# Patient Record
Sex: Female | Born: 1980 | Hispanic: Yes | Marital: Single | Smoking: Never smoker
Health system: Southern US, Community
[De-identification: ages and names within clinical notes are randomized; demographics above are authoritative.]

## PROBLEM LIST (undated history)

## (undated) DIAGNOSIS — R87619 Unspecified abnormal cytological findings in specimens from cervix uteri: Secondary | ICD-10-CM

## (undated) DIAGNOSIS — E039 Hypothyroidism, unspecified: Secondary | ICD-10-CM

## (undated) DIAGNOSIS — B977 Papillomavirus as the cause of diseases classified elsewhere: Secondary | ICD-10-CM

## (undated) HISTORY — DX: Unspecified abnormal cytological findings in specimens from cervix uteri: R87.619

## (undated) HISTORY — DX: Papillomavirus as the cause of diseases classified elsewhere: B97.7

## (undated) HISTORY — DX: Hypothyroidism, unspecified: E03.9

---

## 2008-01-27 ENCOUNTER — Emergency Department: Payer: Self-pay | Admitting: Emergency Medicine

## 2009-12-21 ENCOUNTER — Emergency Department: Payer: Self-pay | Admitting: Emergency Medicine

## 2018-04-10 ENCOUNTER — Ambulatory Visit: Payer: Self-pay | Admitting: Obstetrics and Gynecology

## 2018-04-10 ENCOUNTER — Ambulatory Visit: Payer: Self-pay

## 2018-04-17 ENCOUNTER — Ambulatory Visit: Payer: Self-pay

## 2018-05-10 ENCOUNTER — Ambulatory Visit: Payer: Self-pay

## 2018-05-10 ENCOUNTER — Ambulatory Visit: Payer: Self-pay | Admitting: Obstetrics and Gynecology

## 2018-05-22 ENCOUNTER — Other Ambulatory Visit (HOSPITAL_COMMUNITY)
Admission: RE | Admit: 2018-05-22 | Discharge: 2018-05-22 | Disposition: A | Discharge status: Home / Self Care | Payer: Self-pay | Source: Ambulatory Visit | Attending: Obstetrics and Gynecology | Admitting: Obstetrics and Gynecology

## 2018-05-22 ENCOUNTER — Encounter: Payer: Self-pay | Admitting: Obstetrics and Gynecology

## 2018-05-22 ENCOUNTER — Ambulatory Visit: Payer: Self-pay | Attending: Oncology

## 2018-05-22 ENCOUNTER — Other Ambulatory Visit: Payer: Self-pay

## 2018-05-22 ENCOUNTER — Encounter (INDEPENDENT_AMBULATORY_CARE_PROVIDER_SITE_OTHER): Payer: Self-pay

## 2018-05-22 ENCOUNTER — Ambulatory Visit (INDEPENDENT_AMBULATORY_CARE_PROVIDER_SITE_OTHER): Payer: Self-pay | Admitting: Obstetrics and Gynecology

## 2018-05-22 VITALS — BP 110/64 | Ht 60.0 in | Wt 126.0 lb

## 2018-05-22 VITALS — BP 126/88 | HR 99 | Temp 98.1°F | Ht 60.0 in | Wt 126.0 lb

## 2018-05-22 DIAGNOSIS — R87612 Low grade squamous intraepithelial lesion on cytologic smear of cervix (LGSIL): Secondary | ICD-10-CM | POA: Insufficient documentation

## 2018-05-22 NOTE — Progress Notes (Signed)
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  She is scheduled consult with Dr. Jean [facility]enthalJackson at [facility] this afternoon, with colposcopy and possible biopsy.

## 2018-05-22 NOTE — Progress Notes (Signed)
Referring Provider:  [facility] at [facility] for LGSIL pap smear.   HPI:  Jennifer Hester is a 37 y.o.  [id number]  who presents today in referral from [facility] program at [facility], Coralee RudAnne Shaver, RN, for evaluation and management of abnormal cervical cytology.  The patient has no acute complaints today.  Dysplasia History:  LGSIL, HPV +   OB History  Gravida Para Term Preterm AB Living  9 8 8   1 8   SAB TAB Ectopic Multiple Live Births  1       8    # Outcome Date GA Lbr Len/2nd Weight Sex Delivery Anes PTL Lv  9 Term           8 Term           7 Term           6 Term           5 Term           4 Term           3 Term           2 Term           1 SAB             Past Medical History:  Diagnosis Date  . Abnormal Pap smear of cervix   . Human papilloma virus   . Hypothyroidism     Past Surgical History:  Procedure Laterality Date  . CESAREAN SECTION      SOCIAL HISTORY:  Social History   Substance and Sexual Activity  Alcohol Use Never  . Frequency: Never    Social History   Substance and Sexual Activity  Drug Use Never     Family History  Problem Relation Age of Onset  . Hypertension Mother     ALLERGIES:  Patient has no known allergies.  Current Outpatient Medications on File Prior to Visit  Medication Sig Dispense Refill  . ferrous sulfate 325 (65 FE) MG tablet Take by mouth.    . Multiple Vitamin (MULTIVITAMIN) tablet Take 1 tablet by mouth daily.     No current facility-administered medications on file prior to visit.     Physical Exam: -Vitals:  BP 110/64   Ht 5' (1.524 m)   Wt 126 lb (57.2 kg)   LMP 05/07/2018   BMI 24.61 kg/m  GEN: WD, WN, NAD.  A+ O x 3, good mood and affect. ABD:  NT, ND.  Soft, no masses.  No hernias noted.   Pelvic:   Vulva: Normal appearance.  No lesions.  Vagina: No lesions or abnormalities noted.  Support: Normal pelvic support.  Urethra No masses tenderness or scarring.  Meatus Normal size without lesions or  prolapse.  Cervix: See below.  Anus: Normal exam.  No lesions.  Perineum: Normal exam.  No lesions.        Bimanual   Uterus: Normal size.  Non-tender.  Mobile.  AV.  Adnexae: No masses.  Non-tender to palpation.  Cul-de-sac: Negative for abnormality.   PROCEDURE: 1.  Urine Pregnancy Test:  negative 2.  Colposcopy performed with 4% acetic acid after verbal consent obtained                                         -Aceto-white Lesions Location(s): mildly diffusely              -  Biopsy performed at 2, 6, and 10 o'clock               -ECC indicated and performed: Yes.       -Biopsy sites made hemostatic with pressure, AgNO3, and/or Monsel's solution   -Satisfactory colposcopy: No.    -Evidence of Invasive cervical CA :  NO  ASSESSMENT:  Jennifer Hester is a 37 y.o. [id number] here for  1. LGSIL on Pap smear of cervix   .  PLAN: I discussed the grading system of pap smears and HPV high risk viral types.  We will discuss and base management after colpo results return.      Thomasene Mohair, MD  [facility], [facility] 05/22/2018  2:53 PM   CC: BCCCP Program Coralee Rud, RN [facility]

## 2018-05-25 ENCOUNTER — Encounter: Payer: Self-pay | Admitting: Obstetrics and Gynecology

## 2018-05-25 ENCOUNTER — Telehealth: Payer: Self-pay | Admitting: Obstetrics and Gynecology

## 2018-05-25 NOTE — Telephone Encounter (Signed)
Patient notified of CIN 1 results and recommendation to repeat pap smear in one year.  She voiced understanding and agreement.

## 2018-08-09 NOTE — Progress Notes (Addendum)
Patient scheduled for 1 year follow-up pap per Dr. Jean RosenthalJackson request.  Appointment is scheduled for BCCCP on 06/06/2019.  At that visit she is eligible for a mammogram since she turned 38 in March 2020.  Mailed appointment information.  Interpreter scheduled.  Copy to [facility].

## 2019-06-06 ENCOUNTER — Other Ambulatory Visit: Payer: Self-pay

## 2019-06-06 ENCOUNTER — Ambulatory Visit
Admission: RE | Admit: 2019-06-06 | Discharge: 2019-06-06 | Disposition: A | Discharge status: Home / Self Care | Payer: Self-pay | Source: Ambulatory Visit | Attending: Oncology | Admitting: Oncology

## 2019-06-06 ENCOUNTER — Ambulatory Visit: Payer: Self-pay | Attending: Oncology

## 2019-06-06 ENCOUNTER — Encounter (INDEPENDENT_AMBULATORY_CARE_PROVIDER_SITE_OTHER): Payer: Self-pay

## 2019-06-06 VITALS — BP 113/59 | HR 72 | Temp 96.4°F | Ht 60.5 in | Wt 116.0 lb

## 2019-06-06 DIAGNOSIS — Z0000 Encounter for general adult medical examination without abnormal findings: Secondary | ICD-10-CM

## 2019-06-06 NOTE — Progress Notes (Signed)
  Subjective:     Patient ID: Jennifer Hester, female   DOB: 01-15-1981, 38 y.o.   MRN: [id number]  HPI   Review of Systems     Objective:   Physical Exam Chest:     Breasts:        Right: No swelling, bleeding, inverted nipple, mass, nipple discharge, skin change or tenderness.        Left: No swelling, bleeding, inverted nipple, mass, nipple discharge, skin change or tenderness.        Assessment:     38 year old patient returns to [facility] for one year follow-up pap per Dr. Glennon Mac .  Patient is now eligible for initial mammogram. Patient screened, and meets BCCCP eligibility.  Patient does not have insurance, Medicare or Medicaid. Instructed patient on breast self awareness using teach back method.  Clinical breast exam unremarkable.  Patient had a miscarriage 3 months ago, and has been bleeding since her miscarriage.  States at first heavy, but is lighter now.      Plan:     Sent for bilateral screening mammogram. Will schedule appointment with Dr. Glennon Mac for pap.

## 2019-06-07 ENCOUNTER — Other Ambulatory Visit: Payer: Self-pay | Admitting: *Deleted

## 2019-06-07 DIAGNOSIS — N63 Unspecified lump in unspecified breast: Secondary | ICD-10-CM

## 2019-07-13 ENCOUNTER — Ambulatory Visit
Admission: RE | Admit: 2019-07-13 | Discharge: 2019-07-13 | Disposition: A | Discharge status: Home / Self Care | Payer: Self-pay | Source: Ambulatory Visit | Attending: Oncology | Admitting: Oncology

## 2019-07-13 ENCOUNTER — Other Ambulatory Visit: Payer: Self-pay | Admitting: *Deleted

## 2019-07-13 DIAGNOSIS — N63 Unspecified lump in unspecified breast: Secondary | ICD-10-CM

## 2020-05-06 NOTE — Progress Notes (Signed)
Radiologist reviewed Birads 3 results with patient.  Scheduled or annual and diagnostic mammogram, and ultrasound on 06/04/20 at 12:30.  Will need follow-up pap at that visit.  Copy to [facility].  Risk Assessment   No risk assessment data for the current encounter  Risk Scores      06/06/2019   Last edited by: Jim Like, RN   5-year risk: 0.2 %   Lifetime risk: 4.7 %

## 2020-06-04 ENCOUNTER — Ambulatory Visit: Payer: Self-pay | Attending: Oncology

## 2020-07-09 ENCOUNTER — Ambulatory Visit: Payer: Self-pay

## 2020-07-23 ENCOUNTER — Ambulatory Visit: Payer: Self-pay | Attending: Oncology

## 2020-07-23 ENCOUNTER — Ambulatory Visit
Admission: RE | Admit: 2020-07-23 | Discharge: 2020-07-23 | Disposition: A | Discharge status: Home / Self Care | Payer: Self-pay | Source: Ambulatory Visit | Attending: Oncology | Admitting: Oncology

## 2020-07-23 ENCOUNTER — Other Ambulatory Visit: Payer: Self-pay

## 2020-07-23 VITALS — BP 112/98 | HR 88 | Temp 99.2°F | Ht 61.5 in | Wt 134.0 lb

## 2020-07-23 DIAGNOSIS — N63 Unspecified lump in unspecified breast: Secondary | ICD-10-CM

## 2020-07-23 DIAGNOSIS — R87612 Low grade squamous intraepithelial lesion on cytologic smear of cervix (LGSIL): Secondary | ICD-10-CM

## 2020-07-23 NOTE — Progress Notes (Signed)
  Subjective:     Patient ID: Jennifer Hester, female   DOB: 1981-06-30, 39 y.o.   MRN: [id number]  HPI   Review of Systems     Objective:   Physical Exam Chest:     Breasts:        Right: No swelling, bleeding, inverted nipple, mass, nipple discharge, skin change or tenderness.        Left: No swelling, bleeding, inverted nipple, mass, nipple discharge, skin change or tenderness.  Genitourinary:    Labia:        Right: No rash, tenderness, lesion or injury.        Left: No rash, tenderness, lesion or injury.      Vagina: No signs of injury and foreign body. No vaginal discharge, erythema, tenderness, bleeding, lesions or prolapsed vaginal walls.     Cervix: Cervical bleeding present. No cervical motion tenderness, discharge, friability, lesion, erythema or eversion.     Uterus: Not deviated, not enlarged, not fixed, not tender and no uterine prolapse.      Adnexa:        Right: No mass, tenderness or fullness.         Left: No mass, tenderness or fullness.       Comments: Bright red blood noted from os when pap collected       Assessment:     39 year old Hispanic patient returns to [facility] clinic.  Being followed for right breast cyst.  Patient screened, and meets BCCCP eligibility.  Patient does not have insurance, Medicare or Medicaid. Instructed patient on breast self awareness using teach back method.  Clinical breast exam unremarkable.  Pelvic exam normal.  Scant bleeding on pap collection.  Patient states her menstrual cycle has beeb irregular, sometimes twice monthly. Had CIN biopsy results in 2019. Risk Assessment    Risk Scores      07/23/2020 06/06/2019   Last edited by: Jim Like, RN Jim Like, RN   5-year risk: 0.3 % 0.2 %   Lifetime risk: 4.7 % 4.7 %            Plan:     Sent for diagnostic mammogram, and ultrasound.  Specimen collected for pap.  To discuss irregular periods, low-grade temps x 4 months with primary provider.

## 2020-07-29 LAB — IGP, APTIMA HPV: HPV Aptima: NEGATIVE

## 2021-08-12 ENCOUNTER — Other Ambulatory Visit: Payer: Self-pay

## 2021-08-12 ENCOUNTER — Ambulatory Visit: Payer: Self-pay

## 2021-08-12 ENCOUNTER — Telehealth: Payer: Self-pay

## 2021-08-12 NOTE — Telephone Encounter (Signed)
Patient called/ pre- screened for BCCCP . Informed patient that she is scheduled to be at [facility] at 1230 today. Patient asked to reschedule mammogram, number provided. No further questions. Interpreter used.

## 2021-08-12 NOTE — Progress Notes (Unsigned)
Patient pre-screened for BCCCP eligibility due to COVID 19 precautions. Two patient identifiers used for verification that I was speaking to correct patient.  Patient unable to Present directly to [facility] today for BCCCP screening mammogram.  Jennifer Hester to schedule diagnostic mammogram. Orders are in.

## 2021-08-21 ENCOUNTER — Other Ambulatory Visit: Payer: Self-pay

## 2021-08-21 ENCOUNTER — Ambulatory Visit
Admission: RE | Admit: 2021-08-21 | Discharge: 2021-08-21 | Disposition: A | Discharge status: Home / Self Care | Payer: Self-pay | Source: Ambulatory Visit | Attending: Oncology | Admitting: Oncology

## 2021-08-21 DIAGNOSIS — N6310 Unspecified lump in the right breast, unspecified quadrant: Secondary | ICD-10-CM

## 2022-09-15 DIAGNOSIS — E890 Postprocedural hypothyroidism: Secondary | ICD-10-CM | POA: Diagnosis not present

## 2022-09-15 DIAGNOSIS — N9089 Other specified noninflammatory disorders of vulva and perineum: Secondary | ICD-10-CM | POA: Diagnosis not present

## 2022-09-15 DIAGNOSIS — N870 Mild cervical dysplasia: Secondary | ICD-10-CM | POA: Diagnosis not present

## 2022-09-15 DIAGNOSIS — R102 Pelvic and perineal pain: Secondary | ICD-10-CM | POA: Diagnosis not present

## 2022-09-15 DIAGNOSIS — E1165 Type 2 diabetes mellitus with hyperglycemia: Secondary | ICD-10-CM | POA: Diagnosis not present

## 2022-09-15 DIAGNOSIS — E119 Type 2 diabetes mellitus without complications: Secondary | ICD-10-CM | POA: Diagnosis not present

## 2022-09-15 DIAGNOSIS — Z3009 Encounter for other general counseling and advice on contraception: Secondary | ICD-10-CM | POA: Diagnosis not present

## 2022-09-16 ENCOUNTER — Other Ambulatory Visit: Payer: Self-pay | Admitting: Family Medicine

## 2022-09-16 DIAGNOSIS — Z1231 Encounter for screening mammogram for malignant neoplasm of breast: Secondary | ICD-10-CM

## 2022-10-28 DIAGNOSIS — L811 Chloasma: Secondary | ICD-10-CM | POA: Diagnosis not present

## 2022-11-02 DIAGNOSIS — Z20822 Contact with and (suspected) exposure to covid-19: Secondary | ICD-10-CM | POA: Diagnosis not present

## 2022-11-02 DIAGNOSIS — Z79899 Other long term (current) drug therapy: Secondary | ICD-10-CM | POA: Diagnosis not present

## 2022-11-02 DIAGNOSIS — R197 Diarrhea, unspecified: Secondary | ICD-10-CM | POA: Diagnosis not present

## 2022-11-02 DIAGNOSIS — E079 Disorder of thyroid, unspecified: Secondary | ICD-10-CM | POA: Diagnosis not present

## 2022-11-02 DIAGNOSIS — Z79890 Hormone replacement therapy (postmenopausal): Secondary | ICD-10-CM | POA: Diagnosis not present

## 2022-11-02 DIAGNOSIS — E039 Hypothyroidism, unspecified: Secondary | ICD-10-CM | POA: Diagnosis not present

## 2022-11-02 DIAGNOSIS — Z7984 Long term (current) use of oral hypoglycemic drugs: Secondary | ICD-10-CM | POA: Diagnosis not present

## 2022-11-02 DIAGNOSIS — E119 Type 2 diabetes mellitus without complications: Secondary | ICD-10-CM | POA: Diagnosis not present

## 2022-11-02 DIAGNOSIS — R519 Headache, unspecified: Secondary | ICD-10-CM | POA: Diagnosis not present

## 2022-11-02 DIAGNOSIS — Z1152 Encounter for screening for COVID-19: Secondary | ICD-10-CM | POA: Diagnosis not present

## 2022-11-02 DIAGNOSIS — Z794 Long term (current) use of insulin: Secondary | ICD-10-CM | POA: Diagnosis not present

## 2022-11-02 DIAGNOSIS — E860 Dehydration: Secondary | ICD-10-CM | POA: Diagnosis not present

## 2023-02-15 IMAGING — MG DIGITAL DIAGNOSTIC BILAT W/ TOMO W/ CAD
6 of 10 series · 6 of 30 positions shown · non-contrast
Comparison: Previous exam(s).

CLINICAL DATA: 40-year-old female presenting for follow-up of 2
likely benign masses in the retroareolar right breast.

EXAM:
DIGITAL DIAGNOSTIC BILATERAL MAMMOGRAM WITH TOMOSYNTHESIS AND CAD;
ULTRASOUND RIGHT BREAST LIMITED
TECHNIQUE: Bilateral digital diagnostic mammography and breast tomosynthesis
was performed. The images were evaluated with computer-aided
detection.; Targeted ultrasound examination of the right breast was
performed

[L CC synth-2D]
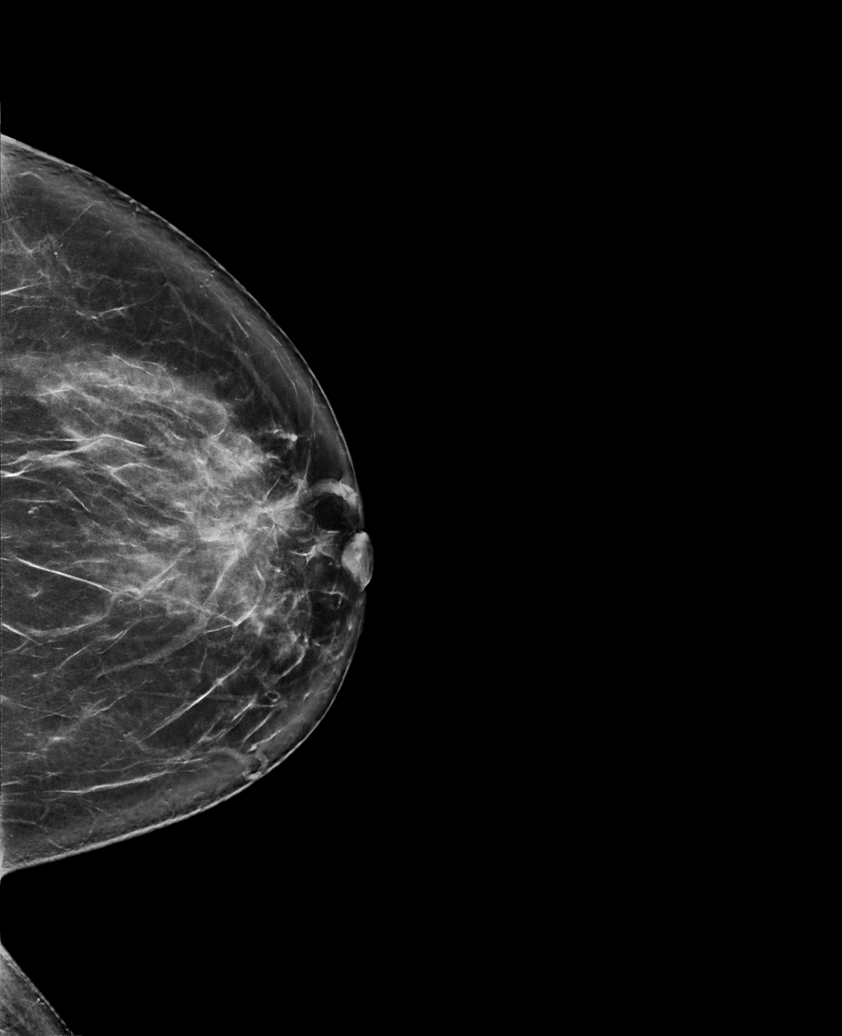

[R CC synth-2D (1 of 2)]
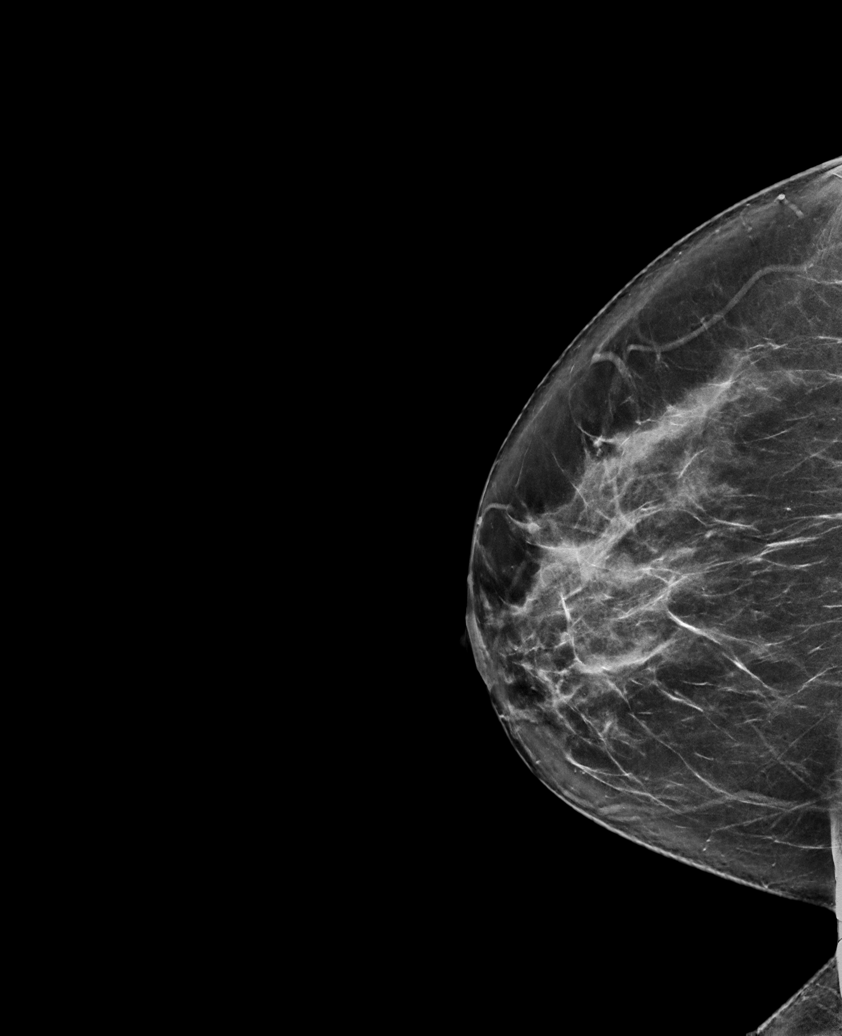

[R MLO synth-2D]
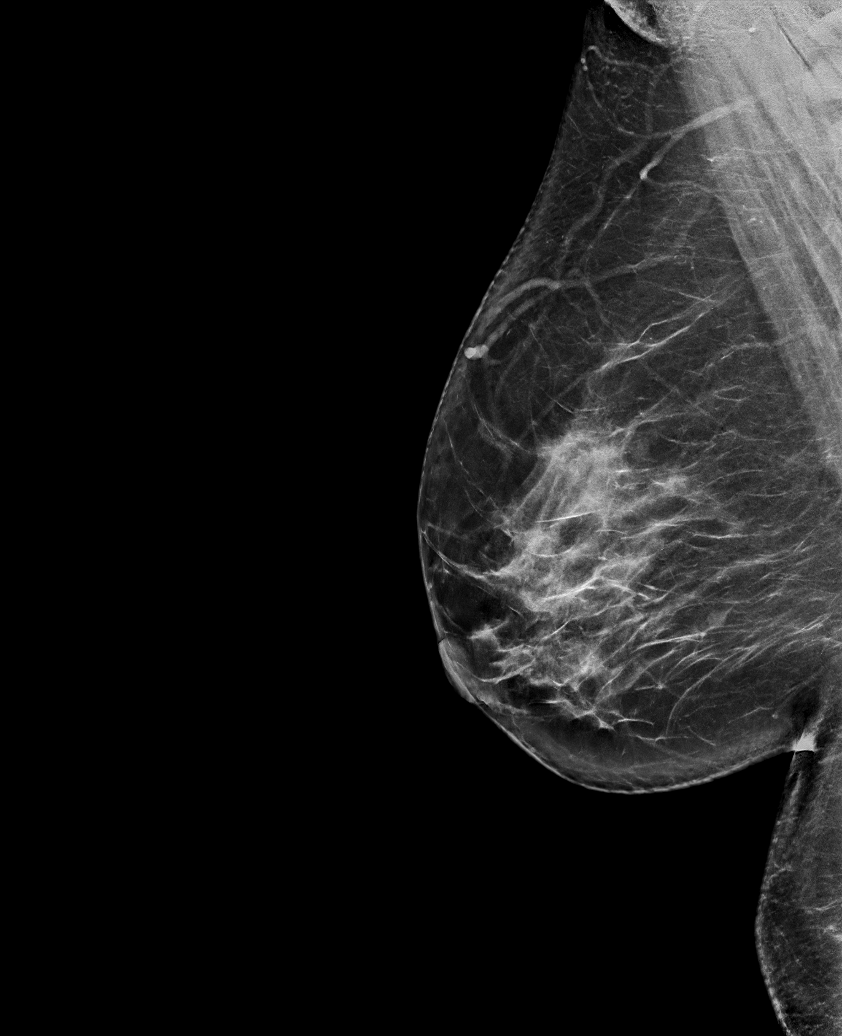

[L MLO synth-2D]
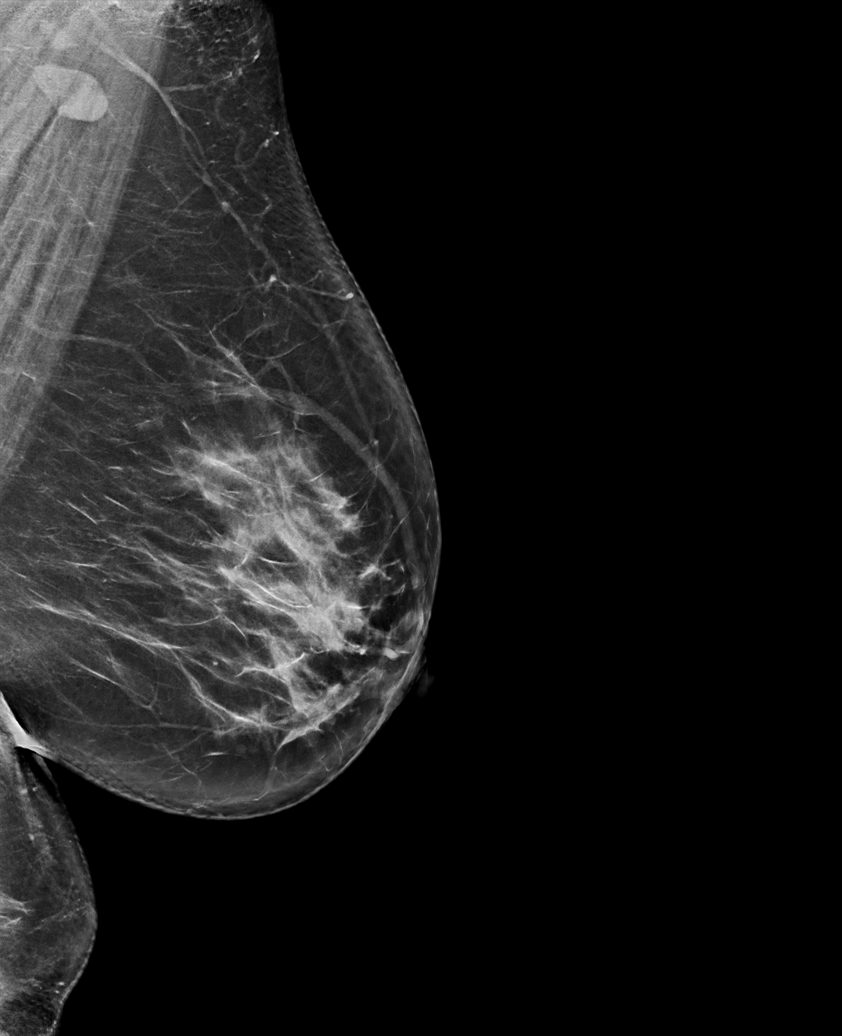

[R CC synth-2D (2 of 2)]
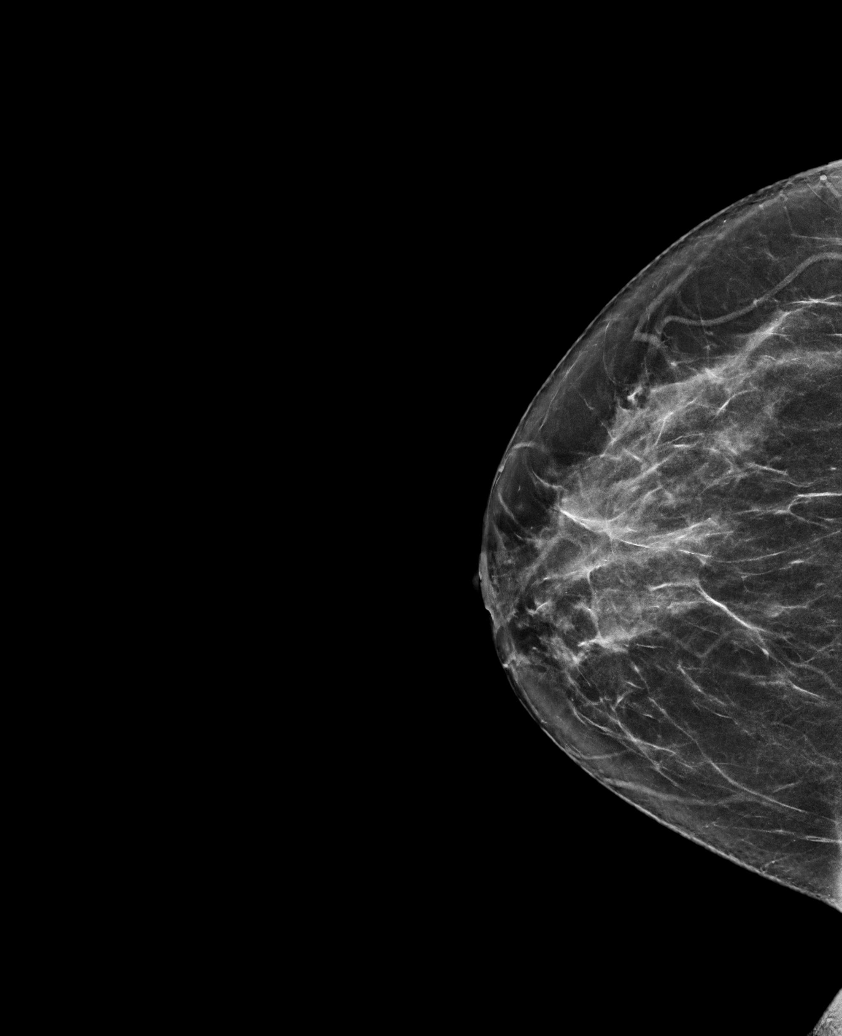

[R CC tomo · tomo slice 35/69.0]
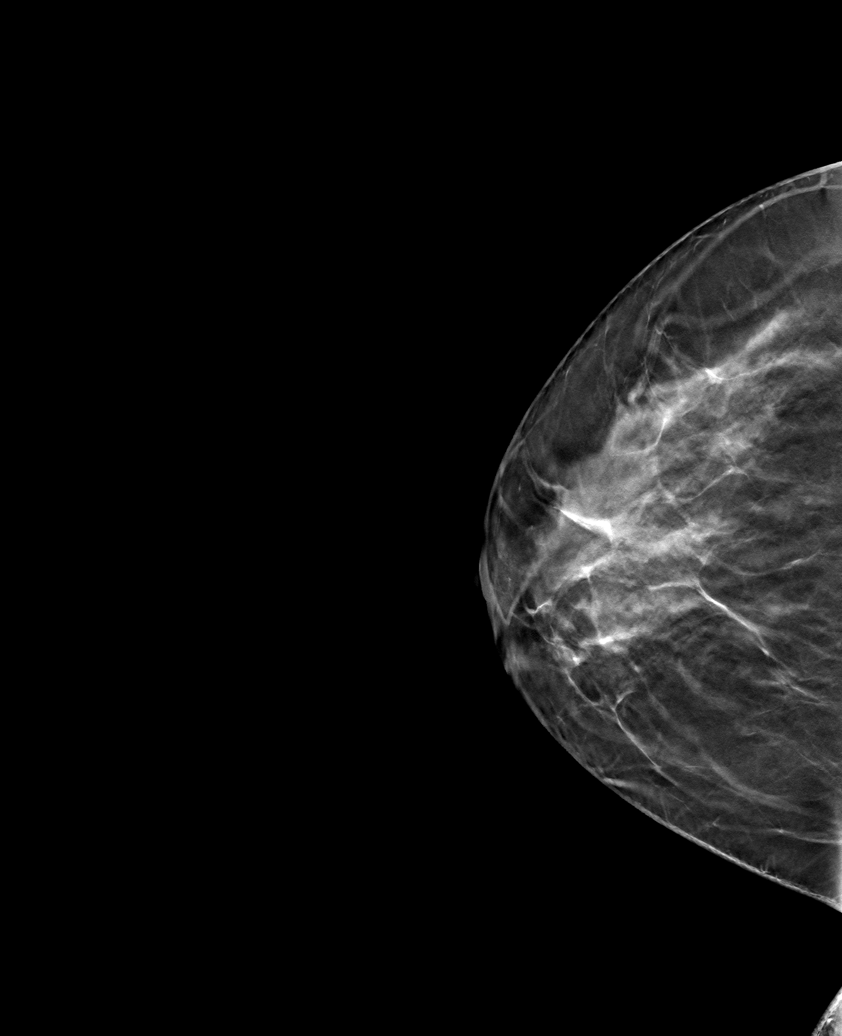

[6 of 30 positions shown; findings below may reference images not displayed]

ACR Breast Density Category c: The breast tissue is heterogeneously
dense, which may obscure small masses.
FINDINGS: No suspicious calcifications, masses or areas of distortion are seen
in the bilateral breasts.

Ultrasound of the retroareolar right breast at 9 o'clock
demonstrates a stable oval hypoechoic mass measuring 0.8 x 0.3 x
cm, previously measuring 0.9 x 0.4 x 0.8 cm.

A second mass in the retroareolar right breast at 9 o'clock measures
1.0 x 0.6 x 0.9 cm, previously measuring 1.1 x 0.8 x 0.8 cm.
IMPRESSION: 1. The 2 likely benign masses in the retroareolar right breast are
stable.

2.  No mammographic evidence of malignancy in the bilateral breasts.

RECOMMENDATION:
Screening mammogram in one year.(Code:MM-7-TAJ)

I have discussed the findings and recommendations with the patient.
If applicable, a reminder letter will be sent to the patient
regarding the next appointment.

BI-RADS CATEGORY  2: Benign.
